# Patient Record
Sex: Female | Born: 1960
Health system: Southern US, Community
[De-identification: ages and names within clinical notes are randomized; demographics above are authoritative.]

---

## 1999-02-08 ENCOUNTER — Other Ambulatory Visit: Admission: RE | Admit: 1999-02-08 | Discharge: 1999-02-08 | Payer: Self-pay | Admitting: *Deleted

## 2001-02-09 ENCOUNTER — Other Ambulatory Visit: Admission: RE | Admit: 2001-02-09 | Discharge: 2001-02-09 | Payer: Self-pay | Admitting: *Deleted

## 2001-03-30 ENCOUNTER — Encounter: Admission: RE | Admit: 2001-03-30 | Discharge: 2001-03-30 | Payer: Self-pay | Admitting: *Deleted

## 2001-03-30 ENCOUNTER — Encounter: Payer: Self-pay | Admitting: *Deleted

## 2002-02-10 ENCOUNTER — Other Ambulatory Visit: Admission: RE | Admit: 2002-02-10 | Discharge: 2002-02-10 | Payer: Self-pay | Admitting: Obstetrics and Gynecology

## 2003-03-07 ENCOUNTER — Other Ambulatory Visit: Admission: RE | Admit: 2003-03-07 | Discharge: 2003-03-07 | Payer: Self-pay | Admitting: *Deleted

## 2004-04-30 ENCOUNTER — Other Ambulatory Visit: Admission: RE | Admit: 2004-04-30 | Discharge: 2004-04-30 | Payer: Self-pay | Admitting: *Deleted

## 2007-01-12 ENCOUNTER — Other Ambulatory Visit: Admission: RE | Admit: 2007-01-12 | Discharge: 2007-01-12 | Payer: Self-pay | Admitting: *Deleted

## 2008-05-05 ENCOUNTER — Emergency Department (HOSPITAL_BASED_OUTPATIENT_CLINIC_OR_DEPARTMENT_OTHER): Admission: EM | Admit: 2008-05-05 | Discharge: 2008-05-05 | Payer: Self-pay | Admitting: Emergency Medicine

## 2008-05-05 ENCOUNTER — Ambulatory Visit: Payer: Self-pay | Admitting: Radiology

## 2008-05-11 ENCOUNTER — Ambulatory Visit (HOSPITAL_BASED_OUTPATIENT_CLINIC_OR_DEPARTMENT_OTHER): Admission: RE | Admit: 2008-05-11 | Discharge: 2008-05-11 | Payer: Self-pay | Admitting: Emergency Medicine

## 2008-05-11 ENCOUNTER — Ambulatory Visit: Payer: Self-pay | Admitting: Interventional Radiology

## 2010-06-07 LAB — COMPREHENSIVE METABOLIC PANEL
ALT: 16 U/L (ref 0–35)
AST: 20 U/L (ref 0–37)
Albumin: 4.6 g/dL (ref 3.5–5.2)
Alkaline Phosphatase: 98 U/L (ref 39–117)
BUN: 17 mg/dL (ref 6–23)
CO2: 27 mEq/L (ref 19–32)
Calcium: 9.7 mg/dL (ref 8.4–10.5)
Chloride: 107 mEq/L (ref 96–112)
Creatinine, Ser: 0.7 mg/dL (ref 0.4–1.2)
GFR calc Af Amer: >60 mL/min (ref >60)
GFR calc non Af Amer: >60 mL/min (ref >60)
Glucose, Bld: 104 mg/dL — ABNORMAL HIGH (ref 70–99)
Potassium: 3.9 mEq/L (ref 3.5–5.1)
Sodium: 145 mEq/L (ref 135–145)
Total Bilirubin: 0.6 mg/dL (ref 0.3–1.2)
Total Protein: 8 g/dL (ref 6.0–8.3)

## 2010-06-07 LAB — CBC
HCT: 43.5 % (ref 36.0–46.0)
Hemoglobin: 14.4 g/dL (ref 12.0–15.0)
MCHC: 33.2 g/dL (ref 30.0–36.0)
MCV: 87.5 fL (ref 78.0–100.0)
Platelets: 302 10*3/uL (ref 150–400)
RBC: 4.97 MIL/uL (ref 3.87–5.11)
RDW: 12.3 % (ref 11.5–15.5)
WBC: 5.6 10*3/uL (ref 4.0–10.5)

## 2010-06-07 LAB — DIFFERENTIAL
Basophils Absolute: 0 10*3/uL (ref 0.0–0.1)
Basophils Relative: 1 % (ref 0–1)
Eosinophils Absolute: 0.1 10*3/uL (ref 0.0–0.7)
Eosinophils Relative: 1 % (ref 0–5)
Lymphocytes Relative: 29 % (ref 12–46)
Lymphs Abs: 1.6 10*3/uL (ref 0.7–4.0)
Monocytes Absolute: 0.3 10*3/uL (ref 0.1–1.0)
Monocytes Relative: 6 % (ref 3–12)
Neutro Abs: 3.6 10*3/uL (ref 1.7–7.7)
Neutrophils Relative %: 63 % (ref 43–77)

## 2014-03-25 ENCOUNTER — Other Ambulatory Visit: Payer: Self-pay | Admitting: Family Medicine

## 2014-03-25 ENCOUNTER — Other Ambulatory Visit (HOSPITAL_COMMUNITY)
Admission: RE | Admit: 2014-03-25 | Discharge: 2014-03-25 | Disposition: A | Discharge status: Home / Self Care | Payer: 59 | Source: Ambulatory Visit | Attending: Family Medicine | Admitting: Family Medicine

## 2014-03-25 DIAGNOSIS — Z1151 Encounter for screening for human papillomavirus (HPV): Secondary | ICD-10-CM | POA: Diagnosis present

## 2014-03-25 DIAGNOSIS — Z124 Encounter for screening for malignant neoplasm of cervix: Secondary | ICD-10-CM | POA: Diagnosis present

## 2014-03-29 LAB — CYTOLOGY - PAP

## 2016-04-15 DIAGNOSIS — M79662 Pain in left lower leg: Secondary | ICD-10-CM | POA: Diagnosis not present

## 2016-05-29 DIAGNOSIS — E782 Mixed hyperlipidemia: Secondary | ICD-10-CM | POA: Diagnosis not present

## 2016-05-29 DIAGNOSIS — R319 Hematuria, unspecified: Secondary | ICD-10-CM | POA: Diagnosis not present

## 2016-05-29 DIAGNOSIS — Z0000 Encounter for general adult medical examination without abnormal findings: Secondary | ICD-10-CM | POA: Diagnosis not present

## 2016-09-18 DIAGNOSIS — Z1231 Encounter for screening mammogram for malignant neoplasm of breast: Secondary | ICD-10-CM | POA: Diagnosis not present

## 2017-03-03 DIAGNOSIS — Z719 Counseling, unspecified: Secondary | ICD-10-CM | POA: Diagnosis not present

## 2017-06-11 DIAGNOSIS — Z0000 Encounter for general adult medical examination without abnormal findings: Secondary | ICD-10-CM | POA: Diagnosis not present

## 2017-06-11 DIAGNOSIS — Z23 Encounter for immunization: Secondary | ICD-10-CM | POA: Diagnosis not present

## 2017-06-11 DIAGNOSIS — E782 Mixed hyperlipidemia: Secondary | ICD-10-CM | POA: Diagnosis not present

## 2017-12-01 DIAGNOSIS — M25512 Pain in left shoulder: Secondary | ICD-10-CM | POA: Diagnosis not present

## 2017-12-04 DIAGNOSIS — M25512 Pain in left shoulder: Secondary | ICD-10-CM | POA: Diagnosis not present

## 2017-12-08 DIAGNOSIS — M25512 Pain in left shoulder: Secondary | ICD-10-CM | POA: Diagnosis not present

## 2018-01-02 DIAGNOSIS — G8918 Other acute postprocedural pain: Secondary | ICD-10-CM | POA: Diagnosis not present

## 2018-01-02 DIAGNOSIS — M7542 Impingement syndrome of left shoulder: Secondary | ICD-10-CM | POA: Diagnosis not present

## 2018-01-02 DIAGNOSIS — S43492A Other sprain of left shoulder joint, initial encounter: Secondary | ICD-10-CM | POA: Diagnosis not present

## 2018-01-02 DIAGNOSIS — S46012A Strain of muscle(s) and tendon(s) of the rotator cuff of left shoulder, initial encounter: Secondary | ICD-10-CM | POA: Diagnosis not present

## 2018-01-02 DIAGNOSIS — M7522 Bicipital tendinitis, left shoulder: Secondary | ICD-10-CM | POA: Diagnosis not present

## 2018-01-02 DIAGNOSIS — S46292A Other injury of muscle, fascia and tendon of other parts of biceps, left arm, initial encounter: Secondary | ICD-10-CM | POA: Diagnosis not present

## 2018-01-02 DIAGNOSIS — S46012D Strain of muscle(s) and tendon(s) of the rotator cuff of left shoulder, subsequent encounter: Secondary | ICD-10-CM | POA: Diagnosis not present

## 2018-01-08 ENCOUNTER — Ambulatory Visit (HOSPITAL_COMMUNITY)
Admission: RE | Admit: 2018-01-08 | Discharge: 2018-01-08 | Disposition: A | Discharge status: Home / Self Care | Payer: 59 | Source: Ambulatory Visit | Attending: Physician Assistant | Admitting: Physician Assistant

## 2018-01-08 ENCOUNTER — Other Ambulatory Visit: Payer: Self-pay | Admitting: Physician Assistant

## 2018-01-08 DIAGNOSIS — M24112 Other articular cartilage disorders, left shoulder: Secondary | ICD-10-CM | POA: Diagnosis not present

## 2018-01-08 DIAGNOSIS — M79602 Pain in left arm: Secondary | ICD-10-CM

## 2018-01-08 NOTE — Progress Notes (Signed)
Left upper extremity venous duplex has been completed. Negative for DVT. [facility]  01/08/18 1:15 PM Olen CordialGreg Rupinder Livingston RVT

## 2018-01-09 DIAGNOSIS — M25512 Pain in left shoulder: Secondary | ICD-10-CM | POA: Diagnosis not present

## 2018-01-09 DIAGNOSIS — S46012D Strain of muscle(s) and tendon(s) of the rotator cuff of left shoulder, subsequent encounter: Secondary | ICD-10-CM | POA: Diagnosis not present

## 2018-01-09 DIAGNOSIS — M6281 Muscle weakness (generalized): Secondary | ICD-10-CM | POA: Diagnosis not present

## 2018-01-13 DIAGNOSIS — S46012D Strain of muscle(s) and tendon(s) of the rotator cuff of left shoulder, subsequent encounter: Secondary | ICD-10-CM | POA: Diagnosis not present

## 2018-01-13 DIAGNOSIS — M25512 Pain in left shoulder: Secondary | ICD-10-CM | POA: Diagnosis not present

## 2018-01-13 DIAGNOSIS — M6281 Muscle weakness (generalized): Secondary | ICD-10-CM | POA: Diagnosis not present

## 2018-01-15 DIAGNOSIS — S46012D Strain of muscle(s) and tendon(s) of the rotator cuff of left shoulder, subsequent encounter: Secondary | ICD-10-CM | POA: Diagnosis not present

## 2018-01-15 DIAGNOSIS — M25512 Pain in left shoulder: Secondary | ICD-10-CM | POA: Diagnosis not present

## 2018-01-15 DIAGNOSIS — M6281 Muscle weakness (generalized): Secondary | ICD-10-CM | POA: Diagnosis not present

## 2018-01-19 DIAGNOSIS — M6281 Muscle weakness (generalized): Secondary | ICD-10-CM | POA: Diagnosis not present

## 2018-01-19 DIAGNOSIS — S46012D Strain of muscle(s) and tendon(s) of the rotator cuff of left shoulder, subsequent encounter: Secondary | ICD-10-CM | POA: Diagnosis not present

## 2018-01-19 DIAGNOSIS — M25512 Pain in left shoulder: Secondary | ICD-10-CM | POA: Diagnosis not present

## 2018-01-26 DIAGNOSIS — S46012D Strain of muscle(s) and tendon(s) of the rotator cuff of left shoulder, subsequent encounter: Secondary | ICD-10-CM | POA: Diagnosis not present

## 2018-01-26 DIAGNOSIS — M6281 Muscle weakness (generalized): Secondary | ICD-10-CM | POA: Diagnosis not present

## 2018-01-26 DIAGNOSIS — M25512 Pain in left shoulder: Secondary | ICD-10-CM | POA: Diagnosis not present

## 2018-01-29 DIAGNOSIS — S46012D Strain of muscle(s) and tendon(s) of the rotator cuff of left shoulder, subsequent encounter: Secondary | ICD-10-CM | POA: Diagnosis not present

## 2018-01-29 DIAGNOSIS — M6281 Muscle weakness (generalized): Secondary | ICD-10-CM | POA: Diagnosis not present

## 2018-01-29 DIAGNOSIS — M25512 Pain in left shoulder: Secondary | ICD-10-CM | POA: Diagnosis not present

## 2018-02-05 DIAGNOSIS — S46012D Strain of muscle(s) and tendon(s) of the rotator cuff of left shoulder, subsequent encounter: Secondary | ICD-10-CM | POA: Diagnosis not present

## 2018-02-05 DIAGNOSIS — M25512 Pain in left shoulder: Secondary | ICD-10-CM | POA: Diagnosis not present

## 2018-02-05 DIAGNOSIS — M6281 Muscle weakness (generalized): Secondary | ICD-10-CM | POA: Diagnosis not present

## 2018-02-09 DIAGNOSIS — M25512 Pain in left shoulder: Secondary | ICD-10-CM | POA: Diagnosis not present

## 2018-02-09 DIAGNOSIS — S46012D Strain of muscle(s) and tendon(s) of the rotator cuff of left shoulder, subsequent encounter: Secondary | ICD-10-CM | POA: Diagnosis not present

## 2018-02-09 DIAGNOSIS — M6281 Muscle weakness (generalized): Secondary | ICD-10-CM | POA: Diagnosis not present

## 2018-02-12 DIAGNOSIS — M6281 Muscle weakness (generalized): Secondary | ICD-10-CM | POA: Diagnosis not present

## 2018-02-12 DIAGNOSIS — S46012D Strain of muscle(s) and tendon(s) of the rotator cuff of left shoulder, subsequent encounter: Secondary | ICD-10-CM | POA: Diagnosis not present

## 2018-02-12 DIAGNOSIS — M25512 Pain in left shoulder: Secondary | ICD-10-CM | POA: Diagnosis not present

## 2018-02-16 DIAGNOSIS — S46012D Strain of muscle(s) and tendon(s) of the rotator cuff of left shoulder, subsequent encounter: Secondary | ICD-10-CM | POA: Diagnosis not present

## 2018-02-16 DIAGNOSIS — M25512 Pain in left shoulder: Secondary | ICD-10-CM | POA: Diagnosis not present

## 2018-02-16 DIAGNOSIS — M6281 Muscle weakness (generalized): Secondary | ICD-10-CM | POA: Diagnosis not present

## 2018-02-19 DIAGNOSIS — M6281 Muscle weakness (generalized): Secondary | ICD-10-CM | POA: Diagnosis not present

## 2018-02-19 DIAGNOSIS — S46012D Strain of muscle(s) and tendon(s) of the rotator cuff of left shoulder, subsequent encounter: Secondary | ICD-10-CM | POA: Diagnosis not present

## 2018-02-19 DIAGNOSIS — M25512 Pain in left shoulder: Secondary | ICD-10-CM | POA: Diagnosis not present

## 2018-02-23 DIAGNOSIS — M6281 Muscle weakness (generalized): Secondary | ICD-10-CM | POA: Diagnosis not present

## 2018-02-23 DIAGNOSIS — M25512 Pain in left shoulder: Secondary | ICD-10-CM | POA: Diagnosis not present

## 2018-02-23 DIAGNOSIS — S46012D Strain of muscle(s) and tendon(s) of the rotator cuff of left shoulder, subsequent encounter: Secondary | ICD-10-CM | POA: Diagnosis not present

## 2018-03-02 DIAGNOSIS — S46012D Strain of muscle(s) and tendon(s) of the rotator cuff of left shoulder, subsequent encounter: Secondary | ICD-10-CM | POA: Diagnosis not present

## 2018-03-02 DIAGNOSIS — M6281 Muscle weakness (generalized): Secondary | ICD-10-CM | POA: Diagnosis not present

## 2018-03-02 DIAGNOSIS — M25512 Pain in left shoulder: Secondary | ICD-10-CM | POA: Diagnosis not present

## 2018-03-06 DIAGNOSIS — M25512 Pain in left shoulder: Secondary | ICD-10-CM | POA: Diagnosis not present

## 2018-03-06 DIAGNOSIS — S46012D Strain of muscle(s) and tendon(s) of the rotator cuff of left shoulder, subsequent encounter: Secondary | ICD-10-CM | POA: Diagnosis not present

## 2018-03-06 DIAGNOSIS — M6281 Muscle weakness (generalized): Secondary | ICD-10-CM | POA: Diagnosis not present

## 2018-03-13 DIAGNOSIS — S46012D Strain of muscle(s) and tendon(s) of the rotator cuff of left shoulder, subsequent encounter: Secondary | ICD-10-CM | POA: Diagnosis not present

## 2018-03-13 DIAGNOSIS — M25512 Pain in left shoulder: Secondary | ICD-10-CM | POA: Diagnosis not present

## 2018-03-13 DIAGNOSIS — M6281 Muscle weakness (generalized): Secondary | ICD-10-CM | POA: Diagnosis not present

## 2018-03-17 DIAGNOSIS — S46012D Strain of muscle(s) and tendon(s) of the rotator cuff of left shoulder, subsequent encounter: Secondary | ICD-10-CM | POA: Diagnosis not present

## 2018-03-17 DIAGNOSIS — M6281 Muscle weakness (generalized): Secondary | ICD-10-CM | POA: Diagnosis not present

## 2018-03-17 DIAGNOSIS — M25512 Pain in left shoulder: Secondary | ICD-10-CM | POA: Diagnosis not present

## 2018-03-20 DIAGNOSIS — S46012D Strain of muscle(s) and tendon(s) of the rotator cuff of left shoulder, subsequent encounter: Secondary | ICD-10-CM | POA: Diagnosis not present

## 2018-03-20 DIAGNOSIS — M25512 Pain in left shoulder: Secondary | ICD-10-CM | POA: Diagnosis not present

## 2018-03-20 DIAGNOSIS — M6281 Muscle weakness (generalized): Secondary | ICD-10-CM | POA: Diagnosis not present

## 2018-03-23 DIAGNOSIS — M6281 Muscle weakness (generalized): Secondary | ICD-10-CM | POA: Diagnosis not present

## 2018-03-23 DIAGNOSIS — S46012D Strain of muscle(s) and tendon(s) of the rotator cuff of left shoulder, subsequent encounter: Secondary | ICD-10-CM | POA: Diagnosis not present

## 2018-03-23 DIAGNOSIS — M25512 Pain in left shoulder: Secondary | ICD-10-CM | POA: Diagnosis not present

## 2019-03-02 ENCOUNTER — Other Ambulatory Visit: Payer: Self-pay | Admitting: Cardiology

## 2019-03-02 DIAGNOSIS — Z20822 Contact with and (suspected) exposure to covid-19: Secondary | ICD-10-CM

## 2019-03-04 LAB — NOVEL CORONAVIRUS, NAA: SARS-CoV-2, NAA: NOT DETECTED

## 2021-05-31 ENCOUNTER — Other Ambulatory Visit: Payer: Self-pay | Admitting: Family Medicine

## 2021-05-31 DIAGNOSIS — E782 Mixed hyperlipidemia: Secondary | ICD-10-CM

## 2021-06-29 ENCOUNTER — Ambulatory Visit
Admission: RE | Admit: 2021-06-29 | Discharge: 2021-06-29 | Disposition: A | Discharge status: Home / Self Care | Payer: No Typology Code available for payment source | Source: Ambulatory Visit | Attending: Family Medicine | Admitting: Family Medicine

## 2021-06-29 DIAGNOSIS — E782 Mixed hyperlipidemia: Secondary | ICD-10-CM

## 2021-11-22 ENCOUNTER — Ambulatory Visit
Admission: EM | Admit: 2021-11-22 | Discharge: 2021-11-22 | Disposition: A | Discharge status: Home / Self Care | Payer: 59 | Attending: Internal Medicine | Admitting: Internal Medicine

## 2021-11-22 DIAGNOSIS — T7840XA Allergy, unspecified, initial encounter: Secondary | ICD-10-CM

## 2021-11-22 DIAGNOSIS — R220 Localized swelling, mass and lump, head: Secondary | ICD-10-CM

## 2021-11-22 MED ORDER — DEXAMETHASONE SODIUM PHOSPHATE 10 MG/ML IJ SOLN
10.0000 mg | Freq: Once | INTRAMUSCULAR | Status: AC
  Administered 2021-11-22: 10 mg via INTRAMUSCULAR

## 2021-11-22 MED ORDER — DIPHENHYDRAMINE HCL 25 MG PO CAPS
25.0000 mg | ORAL_CAPSULE | Freq: Once | ORAL | Status: AC
  Administered 2021-11-22: 25 mg via ORAL

## 2021-11-22 MED ORDER — FAMOTIDINE 20 MG PO TABS: 20.0000 mg | ORAL_TABLET | Freq: Two times a day (BID) | ORAL | 0 refills | Status: AC

## 2021-11-22 NOTE — ED Provider Notes (Signed)
[facility]    CSN: [id number] Arrival date & time: 11/22/21  0801      History   Chief Complaint Chief Complaint  Patient presents with   Allergic Reaction    HPI Stephanie Mcmahon is a 61 y.o. female.   Patient presents with upper and lower lip swelling that started this morning upon awakening.  Patient denies any feelings of throat swelling, throat tightness, shortness of breath.  Patient reports lips are very itchy as well.  Denies any associated rash or urticaria elsewhere.  Patient denies any changes in environment including lotions, soaps, detergents, make-up, foods, etc.  Patient reports that she is allergic to sulfa as only known allergy but has not had any associated medications with sulfa lately.  Patient has been taking Benadryl over the past few days for allergies but has not taken any in the past 24 hours.   Allergic Reaction   History reviewed. No pertinent past medical history.  There are no problems to display for this patient.   History reviewed. No pertinent surgical history.  OB History   No obstetric history on file.      Home Medications    Prior to Admission medications   Medication Sig Start Date End Date Taking? Authorizing Provider  famotidine (PEPCID) 20 MG tablet Take 1 tablet (20 mg total) by mouth 2 (two) times daily. 11/22/21  Yes Teodora Medici, FNP    Family History History reviewed. No pertinent family history.  Social History Social History   Tobacco Use   Smoking status: Never   Smokeless tobacco: Never  Substance Use Topics   Alcohol use: Yes    Comment: occ   Drug use: Not Currently     Allergies   Sulfa antibiotics   Review of Systems Review of Systems Per HPI  Physical Exam Triage Vital Signs ED Triage Vitals [11/22/21 0816]  Enc Vitals Group     BP 137/83     Pulse Rate 80     Resp 18     Temp 98.4 F (36.9 C)     Temp Source Oral     SpO2 97 %     Weight      Height      Head  Circumference      Peak Flow      Pain Score 3     Pain Loc      Pain Edu?      Excl. in [place]?    No data found.  Updated Vital Signs BP 137/83 (BP Location: Left Arm)   Pulse 80   Temp 98.4 F (36.9 C) (Oral)   Resp 18   SpO2 97%   Visual Acuity Right Eye Distance:   Left Eye Distance:   Bilateral Distance:    Right Eye Near:   Left Eye Near:    Bilateral Near:     Physical Exam Constitutional:      General: She is not in acute distress.    Appearance: Normal appearance. She is not toxic-appearing or diaphoretic.  HENT:     Head: Normocephalic and atraumatic.     Mouth/Throat:     Lips: Pink.     Pharynx: No pharyngeal swelling.     Comments: Majority of lip swelling is located to lower lip at right side.  Patient does have some minimal swelling throughout upper lip as well.  No obvious lesions or discoloration noted.  No throat swelling noted on exam. Eyes:  Extraocular Movements: Extraocular movements intact.     Conjunctiva/sclera: Conjunctivae normal.  Cardiovascular:     Rate and Rhythm: Normal rate and regular rhythm.     Pulses: Normal pulses.     Heart sounds: Normal heart sounds.  Pulmonary:     Effort: Pulmonary effort is normal. No respiratory distress.     Breath sounds: Normal breath sounds.  Neurological:     General: No focal deficit present.     Mental Status: She is alert and oriented to person, place, and time. Mental status is at baseline.  Psychiatric:        Mood and Affect: Mood normal.        Behavior: Behavior normal.        Thought Content: Thought content normal.        Judgment: Judgment normal.      UC Treatments / Results  Labs (all labs ordered are listed, but only abnormal results are displayed) Labs Reviewed - No data to display  EKG   Radiology No results found.  Procedures Procedures (including critical care time)  Medications Ordered in UC Medications  dexamethasone (DECADRON) injection 10 mg (10 mg  Intramuscular Given 11/22/21 0833)  diphenhydrAMINE (BENADRYL) capsule 25 mg (25 mg Oral Given 11/22/21 1194)    Initial Impression / Assessment and Plan / UC Course  I have reviewed the triage vital signs and the nursing notes.  Pertinent labs & imaging results that were available during my care of the patient were reviewed by me and considered in my medical decision making (see chart for details).     Patient has lip swelling which is consistent with possible allergic reaction.  There are no signs of anaphylaxis, stridor, wheezing, feelings of shortness of breath or throat swelling on exam.  Therefore, do not think that emergent evaluation or epinephrine administration is necessary.  Patient was given IM steroids here in urgent care today along with oral Benadryl.  Patient also prescribed Pepcid.  Patient reports that she does not have any chronic health problems and does not take any daily medications so these medications should be safe.  Patient advised that Benadryl can cause drowsiness but she has already been taking this without any reaction.  Patient was given strict return and ER precautions.  Patient verbalized understanding and was agreeable with plan. Final Clinical Impressions(s) / UC Diagnoses   Final diagnoses:  Allergic reaction, initial encounter  Lip swelling     Discharge Instructions      You were given a steroid injection today in urgent care to help alleviate allergic reaction.  You were also given Benadryl.  I have prescribed you Pepcid which will help alleviate symptoms as well.  Please follow-up if any symptoms persist or worsen.  Please call 911 or go to the emergency department if you have feelings of shortness of breath or throat closing.    ED Prescriptions     Medication Sig Dispense Auth. Provider   famotidine (PEPCID) 20 MG tablet Take 1 tablet (20 mg total) by mouth 2 (two) times daily. 30 tablet [place], Acie Fredrickson, [place]      PDMP not reviewed this  encounter.   Gustavus Bryant, [place] 11/22/21 [phone]

## 2021-11-22 NOTE — Discharge Instructions (Signed)
You were given a steroid injection today in urgent care to help alleviate allergic reaction.  You were also given Benadryl.  I have prescribed you Pepcid which will help alleviate symptoms as well.  Please follow-up if any symptoms persist or worsen.  Please call 911 or go to the emergency department if you have feelings of shortness of breath or throat closing.

## 2021-11-22 NOTE — ED Triage Notes (Signed)
Pt states woke up with swollen top and bottom lip. States been taking benadryl for allergies but didn't last night. Denies any other allergies. Denies difficulty swallowing or throat tightness.

## 2023-06-23 ENCOUNTER — Other Ambulatory Visit: Payer: Self-pay | Admitting: Family Medicine

## 2023-06-23 DIAGNOSIS — Z1231 Encounter for screening mammogram for malignant neoplasm of breast: Secondary | ICD-10-CM

## 2023-07-03 ENCOUNTER — Encounter (HOSPITAL_COMMUNITY): Payer: Self-pay

## 2023-07-10 ENCOUNTER — Ambulatory Visit
Admission: RE | Admit: 2023-07-10 | Discharge: 2023-07-10 | Disposition: A | Discharge status: Home / Self Care | Source: Ambulatory Visit | Attending: Family Medicine | Admitting: Family Medicine

## 2023-07-10 DIAGNOSIS — Z1231 Encounter for screening mammogram for malignant neoplasm of breast: Secondary | ICD-10-CM

## 2023-08-04 IMAGING — CT CT CARDIAC CORONARY ARTERY CALCIUM SCORE
3 series · 13 of 20 positions shown, 15 images · non-contrast
Comparison: None Available.

CLINICAL DATA: Hyperlipidemia, family history

EXAM:
CT CARDIAC CORONARY ARTERY CALCIUM SCORE
TECHNIQUE: Non-contrast imaging through the heart was performed using
prospective ECG gating. Image post processing was performed on an
independent workstation, allowing for quantitative analysis of the
heart and coronary arteries. Note that this exam targets the heart
and the chest was not imaged in its entirety.

[Series 2: calcium scoring 2.00 qr36 bestdiast 71% hrt calciu · axial · 0.42mm/px · z∈[+1645,+1693]mm · 3 of 60 slices shown]
[im 12/60  vessel]
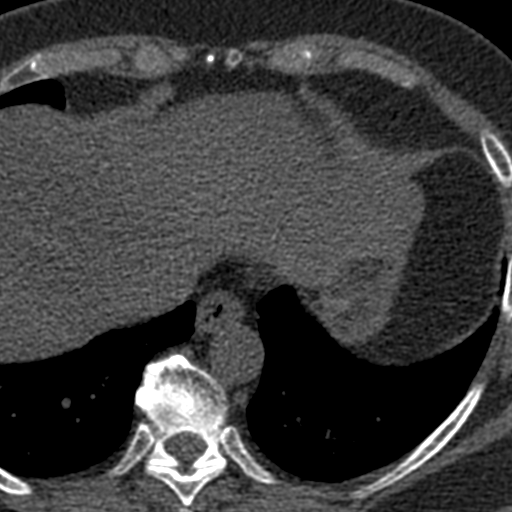
[im 24/60  vessel]
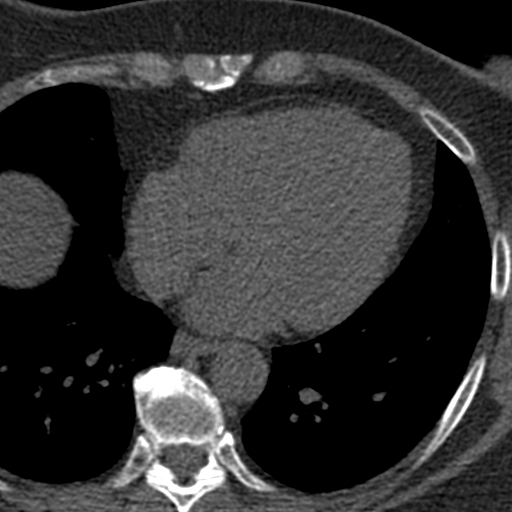
[im 36/60  vessel]
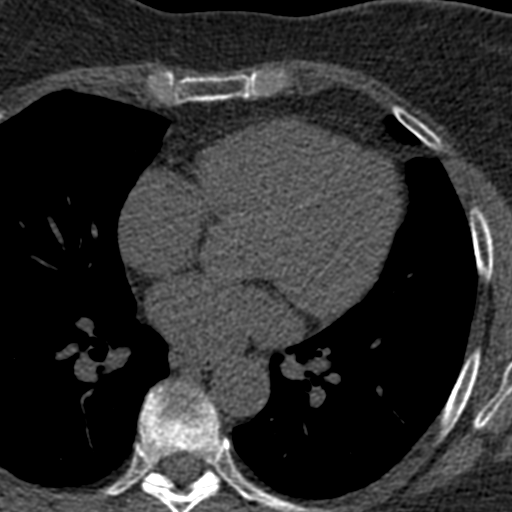

[Series 3: calcium scoring 2.00 br40 bestdiast 71% axial · axial · 0.56mm/px · z∈[+1641,+1721]mm · 5 of 60 slices shown, 7 images]
[im 10/60  vessel]
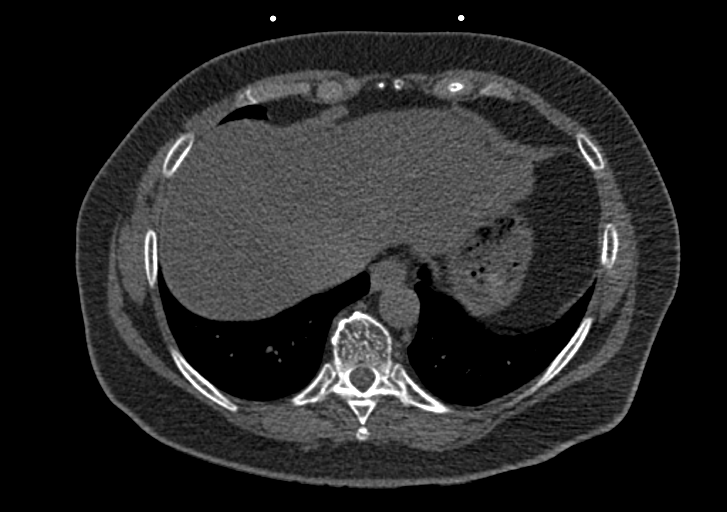
[im 10/60  lung]
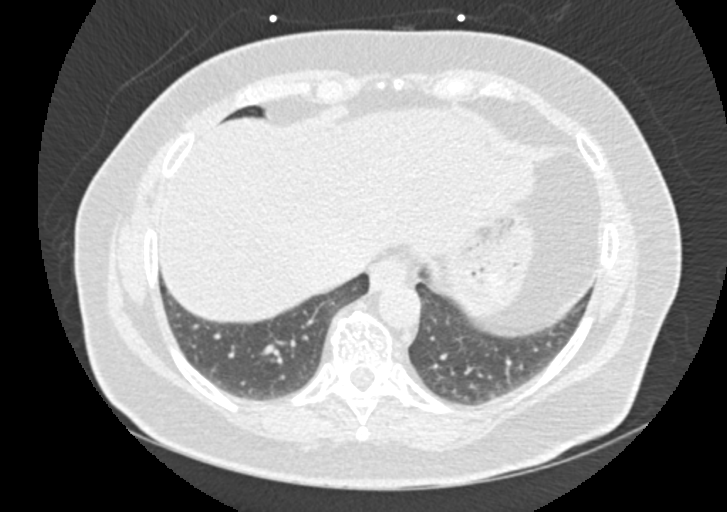
[im 20/60  vessel]
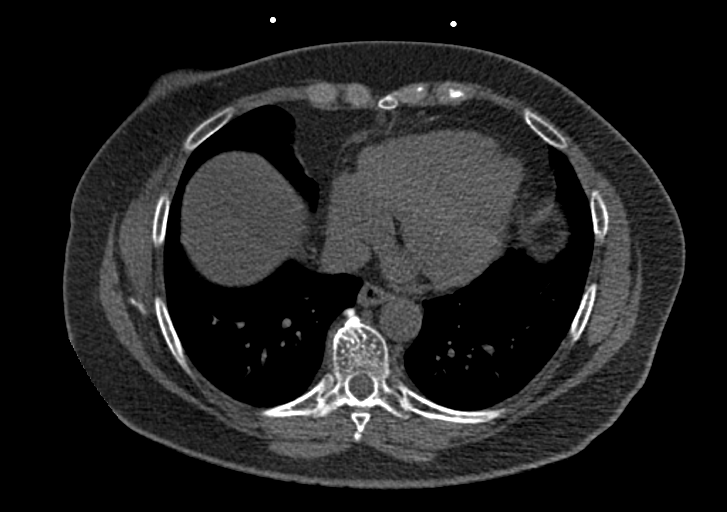
[im 30/60  vessel]
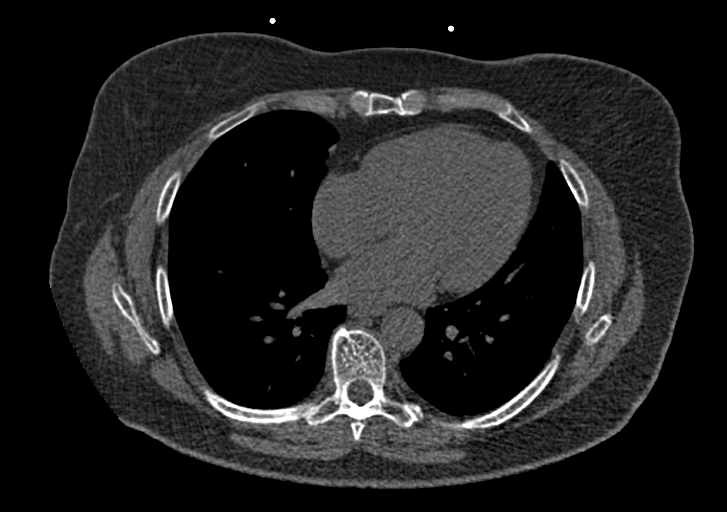
[im 40/60  vessel]
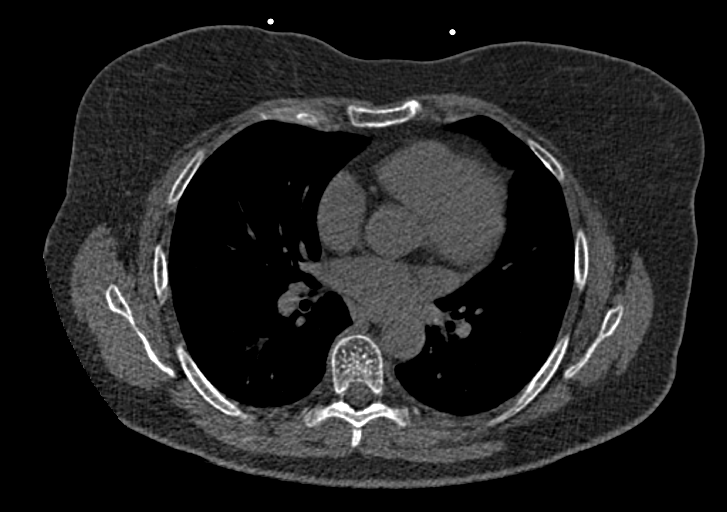
[im 50/60  vessel]
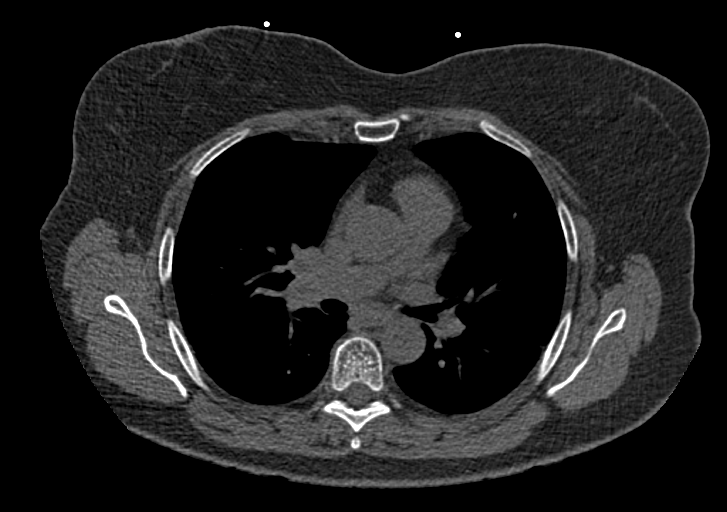
[im 50/60  lung]
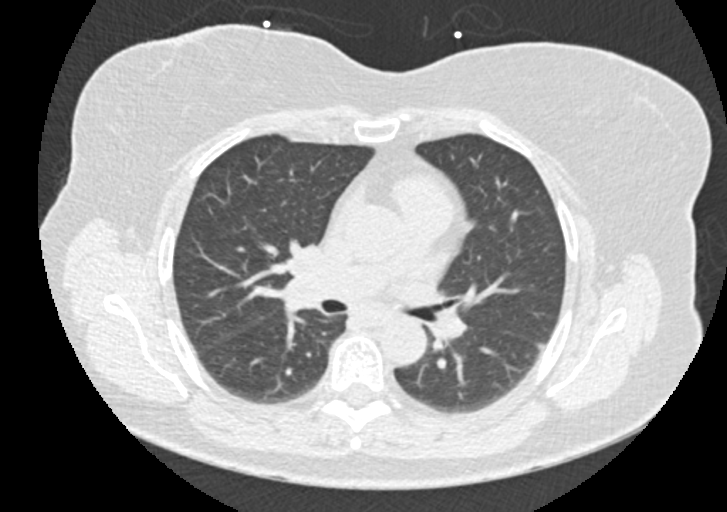

[Series 9: calcium scoring 2.00 br60 bestdiast 71% lungs · axial · 0.56mm/px · z∈[+1641,+1721]mm · 5 of 60 slices shown]
[im 10/60  vessel]
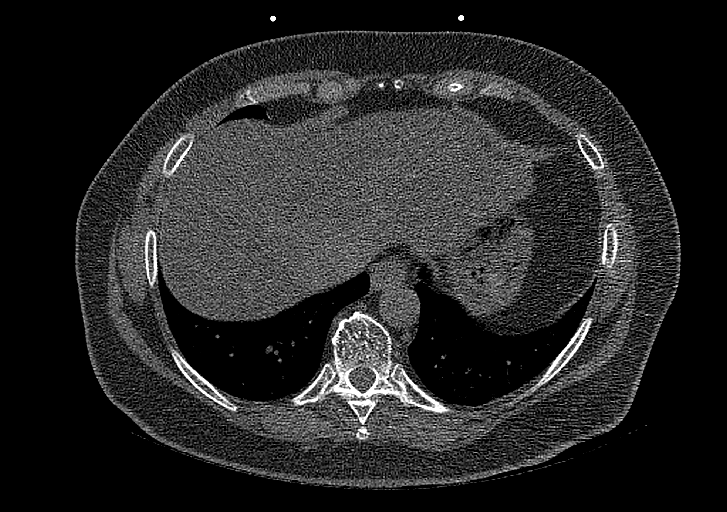
[im 20/60  vessel]
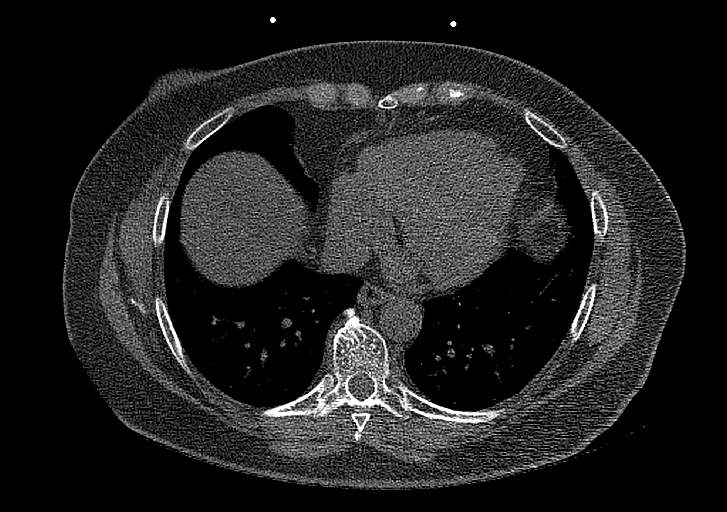
[im 30/60  vessel]
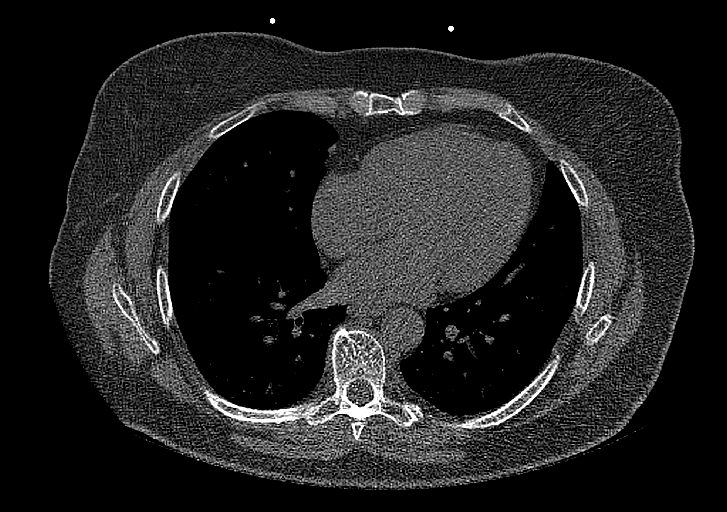
[im 40/60  vessel]
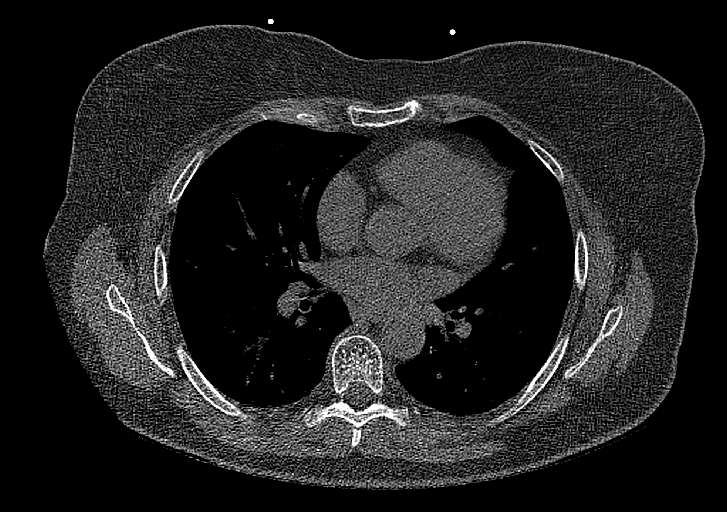
[im 50/60  vessel]
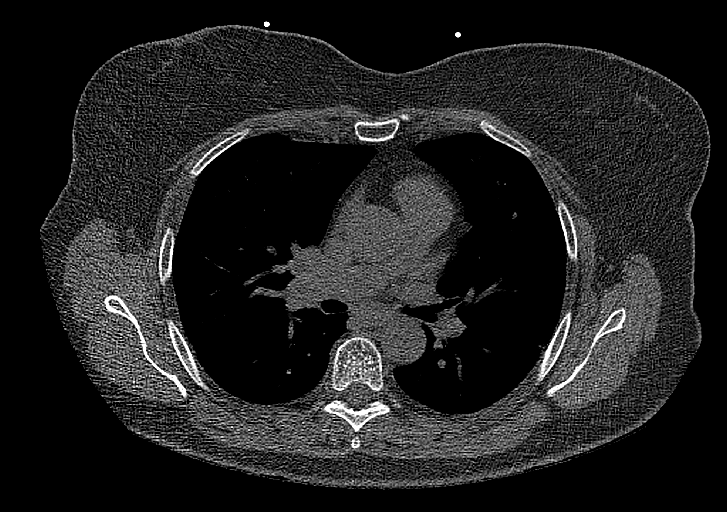

[13 of 20 positions shown; findings below may reference images not displayed]

FINDINGS: CORONARY CALCIUM SCORES:

Left Main: 0

LAD: 0

LCx: 0

RCA: 0

Total Agatston Score: 0

[HOSPITAL] percentile: 0

AORTA MEASUREMENTS:

Ascending Aorta: 29 mm

Descending Aorta: 24 mm

OTHER FINDINGS:

Heart is normal size. Aorta normal caliber. No adenopathy. No
confluent opacities or effusions. No acute findings in the upper
abdomen. Chest wall soft tissues are unremarkable. No acute bony
abnormality.
IMPRESSION: No visible coronary artery calcifications. Total coronary calcium
score of 0.

No acute or significant extracardiac abnormality.
